# Patient Record
Sex: Female | Born: 1995 | Race: White | Hispanic: No | Marital: Single | Smoking: Former smoker
Health system: Southern US, Community
[De-identification: ages and names within clinical notes are randomized; demographics above are authoritative.]

## PROBLEM LIST (undated history)

## (undated) DIAGNOSIS — F429 Obsessive-compulsive disorder, unspecified: Secondary | ICD-10-CM

## (undated) DIAGNOSIS — F319 Bipolar disorder, unspecified: Secondary | ICD-10-CM

## (undated) DIAGNOSIS — F4310 Post-traumatic stress disorder, unspecified: Secondary | ICD-10-CM

---

## 2016-02-17 ENCOUNTER — Emergency Department
Admission: EM | Admit: 2016-02-17 | Discharge: 2016-02-17 | Disposition: A | Discharge status: Home / Self Care | Payer: No Typology Code available for payment source | Attending: Emergency Medicine | Admitting: Emergency Medicine

## 2016-02-17 ENCOUNTER — Encounter: Payer: Self-pay | Admitting: Emergency Medicine

## 2016-02-17 DIAGNOSIS — Z87891 Personal history of nicotine dependence: Secondary | ICD-10-CM | POA: Diagnosis not present

## 2016-02-17 DIAGNOSIS — T7421XA Adult sexual abuse, confirmed, initial encounter: Secondary | ICD-10-CM | POA: Diagnosis not present

## 2016-02-17 HISTORY — DX: Post-traumatic stress disorder, unspecified: F43.10

## 2016-02-17 HISTORY — DX: Bipolar disorder, unspecified: F31.9

## 2016-02-17 HISTORY — DX: Obsessive-compulsive disorder, unspecified: F42.9

## 2016-02-17 LAB — URINALYSIS, ROUTINE W REFLEX MICROSCOPIC
Bacteria, UA: NONE SEEN
Bilirubin Urine: NEGATIVE
GLUCOSE, UA: NEGATIVE mg/dL
KETONES UR: 20 mg/dL — AB
Leukocytes, UA: NEGATIVE
Nitrite: NEGATIVE
PH: 5 (ref 5.0–8.0)
Protein, ur: 30 mg/dL — AB
Specific Gravity, Urine: 1.031 — ABNORMAL HIGH (ref 1.005–1.030)

## 2016-02-17 LAB — POCT PREGNANCY, URINE: Preg Test, Ur: NEGATIVE

## 2016-02-17 MED ORDER — AZITHROMYCIN 500 MG PO TABS
2000.0000 mg | ORAL_TABLET | Freq: Once | ORAL | Status: AC
  Administered 2016-02-17: 2000 mg via ORAL
  Filled 2016-02-17: qty 2

## 2016-02-17 MED ORDER — CEFTRIAXONE SODIUM 250 MG IJ SOLR
INTRAMUSCULAR | Status: AC
  Filled 2016-02-17: qty 250

## 2016-02-17 MED ORDER — LIDOCAINE HCL (PF) 1 % IJ SOLN
INTRAMUSCULAR | Status: DC
Start: 2016-02-17 — End: 2016-02-17
  Filled 2016-02-17: qty 5

## 2016-02-17 MED ORDER — ONDANSETRON 4 MG PO TBDP: ORAL_TABLET | ORAL | 0 refills | Status: DC

## 2016-02-17 MED ORDER — ONDANSETRON 4 MG PO TBDP
4.0000 mg | ORAL_TABLET | Freq: Once | ORAL | Status: AC
  Administered 2016-02-17: 4 mg via ORAL
  Filled 2016-02-17: qty 1

## 2016-02-17 MED ORDER — AZITHROMYCIN 500 MG PO TABS
ORAL_TABLET | ORAL | Status: AC
  Filled 2016-02-17: qty 2

## 2016-02-17 MED ORDER — GENTAMICIN SULFATE 40 MG/ML IJ SOLN
240.0000 mg | Freq: Once | INTRAMUSCULAR | Status: AC
  Administered 2016-02-17: 240 mg via INTRAMUSCULAR
  Filled 2016-02-17: qty 6

## 2016-02-17 NOTE — ED Notes (Signed)
Pt given flagyl 546m (4 tabs) and phenergran 211m*3tabs) given from the sane kit via pyxis. Pt given directions on how to take
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
## 2016-03-03 ENCOUNTER — Encounter: Payer: Self-pay | Admitting: *Deleted

## 2016-03-03 ENCOUNTER — Other Ambulatory Visit: Payer: Self-pay

## 2016-03-03 ENCOUNTER — Inpatient Hospital Stay
Admission: RE | Admit: 2016-03-03 | Discharge: 2016-03-10 | DRG: 885 | Disposition: A | Discharge status: Home / Self Care | Payer: No Typology Code available for payment source | Source: Intra-hospital | Attending: Psychiatry | Admitting: Psychiatry

## 2016-03-03 ENCOUNTER — Emergency Department
Admission: EM | Admit: 2016-03-03 | Discharge: 2016-03-03 | Disposition: A | Discharge status: Other Acute Inpatient Hospital | Payer: No Typology Code available for payment source | Attending: Student in an Organized Health Care Education/Training Program | Admitting: Student in an Organized Health Care Education/Training Program

## 2016-03-03 DIAGNOSIS — F3010 Manic episode without psychotic symptoms, unspecified: Secondary | ICD-10-CM

## 2016-03-03 DIAGNOSIS — Z79899 Other long term (current) drug therapy: Secondary | ICD-10-CM

## 2016-03-03 DIAGNOSIS — F1010 Alcohol abuse, uncomplicated: Secondary | ICD-10-CM | POA: Diagnosis present

## 2016-03-03 DIAGNOSIS — F309 Manic episode, unspecified: Secondary | ICD-10-CM | POA: Insufficient documentation

## 2016-03-03 DIAGNOSIS — F1210 Cannabis abuse, uncomplicated: Secondary | ICD-10-CM | POA: Diagnosis present

## 2016-03-03 DIAGNOSIS — F429 Obsessive-compulsive disorder, unspecified: Secondary | ICD-10-CM | POA: Diagnosis present

## 2016-03-03 DIAGNOSIS — W19XXXA Unspecified fall, initial encounter: Secondary | ICD-10-CM

## 2016-03-03 DIAGNOSIS — Z87891 Personal history of nicotine dependence: Secondary | ICD-10-CM

## 2016-03-03 DIAGNOSIS — F319 Bipolar disorder, unspecified: Secondary | ICD-10-CM

## 2016-03-03 DIAGNOSIS — F419 Anxiety disorder, unspecified: Secondary | ICD-10-CM | POA: Diagnosis present

## 2016-03-03 DIAGNOSIS — G4700 Insomnia, unspecified: Secondary | ICD-10-CM | POA: Diagnosis present

## 2016-03-03 DIAGNOSIS — F312 Bipolar disorder, current episode manic severe with psychotic features: Secondary | ICD-10-CM | POA: Diagnosis present

## 2016-03-03 DIAGNOSIS — F4310 Post-traumatic stress disorder, unspecified: Secondary | ICD-10-CM | POA: Diagnosis present

## 2016-03-03 LAB — COMPREHENSIVE METABOLIC PANEL
ALBUMIN: 5 g/dL (ref 3.5–5.0)
ALK PHOS: 40 U/L (ref 38–126)
ALT: 18 U/L (ref 14–54)
AST: 26 U/L (ref 15–41)
Anion gap: 9 (ref 5–15)
BILIRUBIN TOTAL: 0.5 mg/dL (ref 0.3–1.2)
BUN: 15 mg/dL (ref 6–20)
CALCIUM: 9.4 mg/dL (ref 8.9–10.3)
CO2: 29 mmol/L (ref 22–32)
Chloride: 100 mmol/L — ABNORMAL LOW (ref 101–111)
Creatinine, Ser: 0.62 mg/dL (ref 0.44–1.00)
GFR calc Af Amer: >60 mL/min (ref >60)
Glucose, Bld: 90 mg/dL (ref 65–99)
POTASSIUM: 3.5 mmol/L (ref 3.5–5.1)
Sodium: 138 mmol/L (ref 135–145)
TOTAL PROTEIN: 8.3 g/dL — AB (ref 6.5–8.1)

## 2016-03-03 LAB — URINE DRUG SCREEN, QUALITATIVE (ARMC ONLY)
Amphetamines, Ur Screen: NOT DETECTED
BARBITURATES, UR SCREEN: NOT DETECTED
BENZODIAZEPINE, UR SCRN: NOT DETECTED
CANNABINOID 50 NG, UR ~~LOC~~: NOT DETECTED
Cocaine Metabolite,Ur ~~LOC~~: NOT DETECTED
MDMA (Ecstasy)Ur Screen: NOT DETECTED
Methadone Scn, Ur: NOT DETECTED
OPIATE, UR SCREEN: NOT DETECTED
PHENCYCLIDINE (PCP) UR S: NOT DETECTED
Tricyclic, Ur Screen: NOT DETECTED

## 2016-03-03 LAB — SALICYLATE LEVEL: Salicylate Lvl: <7 mg/dL (ref 2.8–30.0)

## 2016-03-03 LAB — CBC
HEMATOCRIT: 39.1 % (ref 35.0–47.0)
Hemoglobin: 13.6 g/dL (ref 12.0–16.0)
MCH: 31.6 pg (ref 26.0–34.0)
MCHC: 34.7 g/dL (ref 32.0–36.0)
MCV: 91 fL (ref 80.0–100.0)
PLATELETS: 286 10*3/uL (ref 150–440)
RBC: 4.29 MIL/uL (ref 3.80–5.20)
RDW: 13.3 % (ref 11.5–14.5)
WBC: 9.7 10*3/uL (ref 3.6–11.0)

## 2016-03-03 LAB — POCT PREGNANCY, URINE: PREG TEST UR: NEGATIVE

## 2016-03-03 LAB — ACETAMINOPHEN LEVEL: Acetaminophen (Tylenol), Serum: <10 ug/mL — ABNORMAL LOW (ref 10–30)

## 2016-03-03 LAB — ETHANOL: Alcohol, Ethyl (B): <5 mg/dL

## 2016-03-03 MED ORDER — QUETIAPINE FUMARATE 25 MG PO TABS: 50.0000 mg | ORAL_TABLET | Freq: Every day | ORAL | Status: DC

## 2016-03-03 MED ORDER — ACETAMINOPHEN 325 MG PO TABS: 650.0000 mg | ORAL_TABLET | Freq: Four times a day (QID) | ORAL | Status: DC | PRN

## 2016-03-03 MED ORDER — DIAZEPAM 5 MG PO TABS: 10.0000 mg | ORAL_TABLET | Freq: Once | ORAL | Status: DC

## 2016-03-03 MED ORDER — DIVALPROEX SODIUM 500 MG PO DR TAB
500.0000 mg | DELAYED_RELEASE_TABLET | Freq: Two times a day (BID) | ORAL | Status: DC
  Filled 2016-03-03: qty 1

## 2016-03-03 MED ORDER — DIVALPROEX SODIUM 500 MG PO DR TAB: 500.0000 mg | DELAYED_RELEASE_TABLET | Freq: Two times a day (BID) | ORAL | Status: DC

## 2016-03-03 MED ORDER — ALUM & MAG HYDROXIDE-SIMETH 200-200-20 MG/5ML PO SUSP: 30.0000 mL | ORAL | Status: DC | PRN

## 2016-03-03 MED ORDER — MAGNESIUM HYDROXIDE 400 MG/5ML PO SUSP: 30.0000 mL | Freq: Every day | ORAL | Status: DC | PRN

## 2016-03-03 MED ORDER — HYDROXYZINE HCL 25 MG PO TABS
25.0000 mg | ORAL_TABLET | Freq: Three times a day (TID) | ORAL | Status: DC | PRN
  Administered 2016-03-06: 25 mg via ORAL
  Filled 2016-03-03 (×2): qty 1
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
## 2016-03-03 NOTE — BH Assessment (Signed)
Per Dr. Toni Amendlapacs - patient [facility].  Dr. Toni Amendlapacs reports that he has already spoken to the Charge Nurse Jamesetta So(Phyllis) regarding the patient coming the unit this evening.  Writer was not able to speak to the the Charge Nurse to obtain the bed assignment information and to to the nurse working the patient.

## 2016-03-03 NOTE — ED Notes (Signed)
Pt given supper tray.
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
## 2016-03-03 NOTE — ED Notes (Signed)
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
## 2016-03-03 NOTE — ED Notes (Signed)
PT  VOL  SEEN  BY  DR  CLAPACS  PENDING  PLACEMENT 

## 2016-03-03 NOTE — ED Notes (Signed)
pt

## 2016-03-03 NOTE — BH Assessment (Addendum)
[facility] by Dr. Toni Amendlapacs.  Attending Physician will be Dr. Jennet MaduroPucilowska.   Patient has been [facility] to room 315, by [facility] Charge Nurse [place],RN.   ER staff is aware of the admission Delaney Meigs( Tamara, ER Sect.; Sue LushAndrea,  Patient's Nurse & Casmine, Patient Access).  EDP Dr. Roxan Hockeyobinson aware of admission per Sue LushAndrea, RN.   Support paperwork has been completed and submitted to [place], Charity fundraiserN.

## 2016-03-03 NOTE — BH Assessment (Signed)
Pt chart under review by Andrey CotaBukola, Charge RN for bed assignment.
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
## 2016-03-04 ENCOUNTER — Inpatient Hospital Stay: Payer: No Typology Code available for payment source

## 2016-03-04 DIAGNOSIS — F429 Obsessive-compulsive disorder, unspecified: Secondary | ICD-10-CM | POA: Diagnosis present

## 2016-03-04 DIAGNOSIS — F1010 Alcohol abuse, uncomplicated: Secondary | ICD-10-CM | POA: Diagnosis present

## 2016-03-04 DIAGNOSIS — F312 Bipolar disorder, current episode manic severe with psychotic features: Principal | ICD-10-CM

## 2016-03-04 DIAGNOSIS — F1210 Cannabis abuse, uncomplicated: Secondary | ICD-10-CM | POA: Diagnosis present

## 2016-03-04 DIAGNOSIS — F4310 Post-traumatic stress disorder, unspecified: Secondary | ICD-10-CM | POA: Diagnosis present

## 2016-03-04 LAB — LIPID PANEL
Cholesterol: 167 mg/dL (ref 0–200)
HDL: 57 mg/dL (ref >40)
LDL CALC: 102 mg/dL — AB (ref 0–99)
TRIGLYCERIDES: 42 mg/dL (ref <150)
Total CHOL/HDL Ratio: 2.9 RATIO
VLDL: 8 mg/dL (ref 0–40)

## 2016-03-04 LAB — TSH: TSH: 2.596 u[IU]/mL (ref 0.350–4.500)

## 2016-03-04 LAB — VALPROIC ACID LEVEL: Valproic Acid Lvl: 28 ug/mL — ABNORMAL LOW (ref 50.0–100.0)

## 2016-03-04 MED ORDER — LORAZEPAM 2 MG PO TABS
2.0000 mg | ORAL_TABLET | Freq: Once | ORAL | Status: AC
  Administered 2016-03-04: 2 mg via ORAL
  Filled 2016-03-04: qty 1

## 2016-03-04 MED ORDER — DIPHENHYDRAMINE HCL 25 MG PO CAPS
50.0000 mg | ORAL_CAPSULE | Freq: Once | ORAL | Status: AC
  Administered 2016-03-04: 50 mg via ORAL
  Filled 2016-03-04: qty 2

## 2016-03-04 MED ORDER — HALOPERIDOL 2 MG PO TABS
2.0000 mg | ORAL_TABLET | Freq: Once | ORAL | Status: AC
  Administered 2016-03-04: 2 mg via ORAL
  Filled 2016-03-04: qty 1

## 2016-03-04 MED ORDER — ZIPRASIDONE HCL 40 MG PO CAPS
40.0000 mg | ORAL_CAPSULE | Freq: Two times a day (BID) | ORAL | Status: DC
  Administered 2016-03-04 – 2016-03-05 (×2): 40 mg via ORAL
  Filled 2016-03-04 (×2): qty 1

## 2016-03-04 MED ORDER — TEMAZEPAM 15 MG PO CAPS
15.0000 mg | ORAL_CAPSULE | Freq: Every day | ORAL | Status: DC
  Administered 2016-03-04: 15 mg via ORAL
  Filled 2016-03-04: qty 1

## 2016-03-04 NOTE — Progress Notes (Signed)
Recreation Therapy Notes  Date: 02.28.18 Time: 9:30 am Location: Craft Room  Group Topic: Self-esteem  Goal Area(s) Addresses:  Patient will identify at least one positive trait about self. Patient will identify at least one healthy coping skill.  Behavioral Response: Did not attend  Intervention: All About Me  Activity: Patients were instructed to make an All About Me pamphlet including their life's motto, positive traits, healthy coping skills, and their support system.  Education: LRT educated patients on ways they can increase their self-esteem.  Education Outcome: Patient did not attend group.  Clinical Observations/Feedback: Patient invited, but did not attend group.  Zuleyka Kloc M, LRT/CTRS 03/04/2016 10:00 AM 

## 2016-03-04 NOTE — Tx Team (Signed)
Interdisciplinary Treatment and Diagnostic Plan Update  03/04/2016 Time of Session: [place] MRN: [id number]  Principal Diagnosis: Bipolar I disorder, current or most recent episode manic, with psychotic features ([place])  Secondary Diagnoses: Principal Problem:   Bipolar I disorder, current or most recent episode manic, with psychotic features ([place]) Active Problems:   OCD (obsessive compulsive disorder)   PTSD (post-traumatic stress disorder)   Cannabis use disorder, mild, abuse   Alcohol use disorder, mild, abuse   Current Medications:  Current Facility-Administered Medications  Medication Dose Route Frequency Provider Last Rate Last Dose  . acetaminophen (TYLENOL) tablet 650 mg  650 mg Oral Q6H PRN Gonzella Lex, MD      . alum & mag hydroxide-simeth (MAALOX/MYLANTA) 200-200-20 MG/5ML suspension 30 mL  30 mL Oral Q4H PRN Gonzella Lex, MD      . hydrOXYzine (ATARAX/VISTARIL) tablet 25 mg  25 mg Oral TID PRN Gonzella Lex, MD      . magnesium hydroxide (MILK OF MAGNESIA) suspension 30 mL  30 mL Oral Daily PRN Gonzella Lex, MD      . temazepam (RESTORIL) capsule 15 mg  15 mg Oral QHS Jolanta B Pucilowska, MD      . ziprasidone (GEODON) capsule 40 mg  40 mg Oral BID WC Jolanta B Pucilowska, MD       PTA Medications: Prescriptions Prior to Admission  Medication Sig Dispense Refill Last Dose  . divalproex (DEPAKOTE ER) 500 MG 24 hr tablet Take 500 mg by mouth 2 (two) times daily.   UNKNOWN at UNKNOWN  . ondansetron (ZOFRAN ODT) 4 MG disintegrating tablet Allow 1-2 tablets to dissolve in your mouth every 8 hours as needed for nausea/vomiting (Patient not taking: Reported on 03/03/2016) 30 tablet 0 Not Taking at Unknown time  . QUEtiapine (SEROQUEL) 100 MG tablet Take 100-200 mg by mouth at bedtime as needed.    UNKNOWN at UNKNOWN    Patient Stressors: Medication change or noncompliance Substance abuse Traumatic event  Patient Strengths: Curator  fund of knowledge Motivation for treatment/growth  Treatment Modalities: Medication Management, Group therapy, Case management,  1 to 1 session with clinician, Psychoeducation, Recreational therapy.   Physician Treatment Plan for Primary Diagnosis: Bipolar I disorder, current or most recent episode manic, with psychotic features ([place]) Long Term Goal(s): Improvement in symptoms so as ready for discharge Improvement in symptoms so as ready for discharge   Short Term Goals: Ability to identify changes in lifestyle to reduce recurrence of condition will improve Ability to verbalize feelings will improve Ability to disclose and discuss suicidal ideas Ability to demonstrate self-control will improve Ability to identify and develop effective coping behaviors will improve Compliance with prescribed medications will improve Ability to identify triggers associated with substance abuse/mental health issues will improve Ability to identify changes in lifestyle to reduce recurrence of condition will improve Ability to demonstrate self-control will improve Ability to identify triggers associated with substance abuse/mental health issues will improve  Medication Management: Evaluate patient's response, side effects, and tolerance of medication regimen.  Therapeutic Interventions: 1 to 1 sessions, Unit Group sessions and Medication administration.  Evaluation of Outcomes: Not Met  Physician Treatment Plan for Secondary Diagnosis: Principal Problem:   Bipolar I disorder, current or most recent episode manic, with psychotic features ([place]) Active Problems:   OCD (obsessive compulsive disorder)   PTSD (post-traumatic stress disorder)   Cannabis use disorder, mild, abuse   Alcohol use disorder, mild, abuse  Long Term Goal(s): Improvement  in symptoms so as ready for discharge Improvement in symptoms so as ready for discharge   Short Term Goals: Ability to identify changes in lifestyle to reduce  recurrence of condition will improve Ability to verbalize feelings will improve Ability to disclose and discuss suicidal ideas Ability to demonstrate self-control will improve Ability to identify and develop effective coping behaviors will improve Compliance with prescribed medications will improve Ability to identify triggers associated with substance abuse/mental health issues will improve Ability to identify changes in lifestyle to reduce recurrence of condition will improve Ability to demonstrate self-control will improve Ability to identify triggers associated with substance abuse/mental health issues will improve     Medication Management: Evaluate patient's response, side effects, and tolerance of medication regimen.  Therapeutic Interventions: 1 to 1 sessions, Unit Group sessions and Medication administration.  Evaluation of Outcomes: Not Met   RN Treatment Plan for Primary Diagnosis: Bipolar I disorder, current or most recent episode manic, with psychotic features ([place]) Long Term Goal(s): Knowledge of disease and therapeutic regimen to maintain health will improve  Short Term Goals: Ability to demonstrate self-control, Ability to verbalize feelings will improve, Ability to disclose and discuss suicidal ideas, Ability to identify and develop effective coping behaviors will improve and Compliance with prescribed medications will improve  Medication Management: RN will administer medications as ordered by provider, will assess and evaluate patient's response and provide education to patient for prescribed medication. RN will report any adverse and/or side effects to prescribing provider.  Therapeutic Interventions: 1 on 1 counseling sessions, Psychoeducation, Medication administration, Evaluate responses to treatment, Monitor vital signs and CBGs as ordered, Perform/monitor CIWA, COWS, AIMS and Fall Risk screenings as ordered, Perform wound care treatments as ordered.  Evaluation of  Outcomes: Not Met   LCSW Treatment Plan for Primary Diagnosis: Bipolar I disorder, current or most recent episode manic, with psychotic features ([place]) Long Term Goal(s): Safe transition to appropriate next level of care at discharge, Engage patient in therapeutic group addressing interpersonal concerns.  Short Term Goals: Engage patient in aftercare planning with referrals and resources, Facilitate acceptance of mental health diagnosis and concerns and Increase skills for wellness and recovery  Therapeutic Interventions: Assess for all discharge needs, 1 to 1 time with Social worker, Explore available resources and support systems, Assess for adequacy in community support network, Educate family and significant other(s) on suicide prevention, Complete Psychosocial Assessment, Interpersonal group therapy.  Evaluation of Outcomes: Not Met   Progress in Treatment: Attending groups: No. Participating in groups: No. Taking medication as prescribed: Yes. Toleration medication: Yes. Family/Significant other contact made: No, will contact:  when given permission Patient understands diagnosis: Yes. Discussing patient identified problems/goals with staff: Yes. Medical problems stabilized or resolved: Yes. Denies suicidal/homicidal ideation: Yes. Issues/concerns per patient self-inventory: No. Other: none  New problem(s) identified: No, Describe:  none  New Short Term/Long Term Goal(s): Pt could not attend meeting, unable to identify goal.  Discharge Plan or Barriers: CSW assessing for appropriate plan.  Reason for Continuation of Hospitalization: Mania  Estimated Length of Stay: 3-5 days.  Attendees: Patient: 03/04/2016   Physician: Dr. Bary Leriche, MD 03/04/2016   Nursing: Elige Radon, RN 03/04/2016   RN Care Manager: 03/04/2016   Social Worker: Lurline Idol, LCSW 03/04/2016   Recreational Therapist: Everitt Amber, LRT/CTRS  03/04/2016   Other:  03/04/2016   Other:  03/04/2016   Other:  03/04/2016        Scribe for Treatment Team: Joanne Chars, LCSW 03/04/2016 4:14 PM

## 2016-03-04 NOTE — Progress Notes (Signed)
Chaplain received an order to visit with pt in room 315. Pt requested a catholic priest but said she was going to contact the priest. Lunette StandsChaplain is available for follow-up as needed.   03/04/16 1600  Clinical Encounter Type  Visited With Patient  Visit Type Initial;Spiritual support  Referral From Nurse  Consult/Referral To Chaplain  Spiritual Encounters  Spiritual Needs Other (Comment)

## 2016-03-04 NOTE — Progress Notes (Addendum)
Patient was noted sitting on the floor, she stated 'l was in the shower when l bumped my head against the wall, so l lowered my self to the floor" upon assessment no visible injury noted, PERRLA, bilateral hand grip equal, ROME X 4, no internal rotation noted, bilateral legs are equal in length. Patient endorsed a headache and rated pain like 5/10 on a pain scale 0-10, no change in LOC noted, denies nausea. Patient  refused to take medication for the pain.The MD on call notified and CT HEAD SCAN without contrast was ordered. Patient was initially reluctant but eventually agreed to have the test. MD also ordered some medication to help with anxiety, restlessness and hallucination. Patient was educated to call for assistance before getting out of bed, call light within easy reach, more frequent visual checks initiated. Will continue to closely monitor patient.
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
## 2016-03-04 NOTE — [facility] Group Notes (Signed)
[facility] Group Notes:  (Nursing/MHT/Case Management/Adjunct)  Date:  03/04/2016  Time:  5:09 PM  Type of Therapy:  Psychoeducational Skills  Participation Level:  Active  Participation Quality:  Attentive, Monopolizing and Redirectable  Affect:  Excited  Cognitive:  Alert and Oriented  Insight:  Lacking  Engagement in Group:  Engaged and Monopolizing  Modes of Intervention:  Discussion and Education  Summary of Progress/Problems:  Deborah Parker 03/04/2016, 5:09 PM
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
## 2016-03-04 NOTE — Progress Notes (Signed)
POST FALL; Patient appears stable, no apparent injury noted, VS WNL, patient denies nausea and headache. Patient requested family not be notified of the fall, no acute distress noted at this time. Writer awaiting CT scan report, 15 minutes checks maintained, will continue to monitor.

## 2016-03-04 NOTE — Progress Notes (Signed)
MHT reports that she found patient laying in the floor and the bathroom and patient states that she did not fall. Went to talk with patient.  Patient reports "I was in the shower and felt dizzy so I lay down in the floor and I pooped myself"  Small amount of stool noted in the shower.
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
## 2016-03-04 NOTE — BHH Group Notes (Signed)
BHH LCSW Group Therapy  03/04/2016 4:20 PM  Initially came to group, set down her things and then left without returning until the group was ending.  Deborah Parker 03/04/2016, 4:20 PM

## 2016-03-05 LAB — PROLACTIN: PROLACTIN: 76.2 ng/mL — AB (ref 4.8–23.3)

## 2016-03-05 LAB — HEMOGLOBIN A1C
Hgb A1c MFr Bld: 5.1 % (ref 4.8–5.6)
MEAN PLASMA GLUCOSE: 100 mg/dL

## 2016-03-05 MED ORDER — TEMAZEPAM 15 MG PO CAPS
30.0000 mg | ORAL_CAPSULE | Freq: Every day | ORAL | Status: DC
  Administered 2016-03-05 – 2016-03-08 (×3): 30 mg via ORAL
  Filled 2016-03-05 (×4): qty 2

## 2016-03-05 MED ORDER — ZIPRASIDONE HCL 40 MG PO CAPS
80.0000 mg | ORAL_CAPSULE | Freq: Two times a day (BID) | ORAL | Status: DC
  Administered 2016-03-05 – 2016-03-10 (×10): 80 mg via ORAL
  Filled 2016-03-05 (×10): qty 2

## 2016-03-05 NOTE — Plan of Care (Signed)
Problem: Coping: Goal: Ability to cope will improve Outcome: Not Met (add Reason) Patient remains anxious with delusions of grandeur. Hyper verbal. Restless. Po bedtime medications given. No PRNs. q 15 min checks maintained for safety.
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
## 2016-03-05 NOTE — BHH Group Notes (Signed)
BHH Group Notes:  (Nursing/MHT/Case Management/Adjunct)  Date:  03/05/2016  Time:  1:27 AM  Type of Therapy:  Psychoeducational Skills  Participation Level:  Active  Participation Quality:  Appropriate  Affect:  Appropriate  Cognitive:  Alert  Insight:  Good  Engagement in Group:  Supportive  Modes of Intervention:  Support  Summary of Progress/Problems:  Deborah Parker 03/05/2016, 1:27 AM
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
## 2016-03-05 NOTE — Progress Notes (Signed)
Pt requesting discharged, signed request for discharge at 1534 on Thursday, March 05, 2016. Signed form placed chart. MD paged. Safety maintained. Will continue to monitor.

## 2016-03-05 NOTE — Progress Notes (Signed)
Pt awake, alert, and oriented up on unit today with multiple repeated requests. Pt is psychotic, manic, disorganized with flight of ideas, rapid, pressured speech. Reported fall x 3 last night, but after speaking with doctor, pt had fell the day/night before as noted, pt is just repeating the story of events to this nurse. Pt is very hyper verbal. Continues to request the catholic priest from [employer] to visit her, pt reeducated on visiting hours and phone times. Pt reports good sleep last night although it is reported she only slept 4 hours. Rates appetite good, hyper energy, poor concentration related to "concussion." Pt repeatedly requests "those sunglasses they give you when they dialyze your eyes because my eyes are sensitive to all these bright lights." Pt denies SI/HI/AVH. Reports today her goal is "slowing my brain down" by "seeing a priest." Rates depression 4-5/10, hopelessness 0/10, anxiety 8/10 (low 0-10 high). Pt is medication compliant today. Attended some groups, but often leaves early.  Support and encouragement provided. Medications administered as ordered with education. Safety maintained with every 15 minute checks. Will continue to monitor.

## 2016-03-05 NOTE — Progress Notes (Signed)
Recreation Therapy Notes  Date: 03.01.18 Time: 9:30 am Location: Craft Room  Group Topic: Leisure Education  Goal Area(s) Addresses:  Patient will identify things they are grateful for. Patient will identify how being grateful can influence decision making.  Behavioral Response: Attentive, Interactive, Left early  Intervention: Grateful Wheel  Activity: Patients were given an I Am Grateful For worksheet and were instructed to write items they were grateful for under each category.  Education: LRT educated patients on leisure and why it is important.  Education Outcome: Patient left before LRT educated group.  Clinical Observations/Feedback: Patient wrote things she was grateful for. Patient laid her head on the table then stated she kept falling in the bathroom and had a concussion and wanted sunglasses. Patient left group at approximately 9:47 am to talk to her nurse. Patient returned to group stating she needed to lay down and was there to get her things. Patient stated things she is grateful for and left. Patient did not return to group.  Jacquelynn CreeGreene,Endya Austin M, LRT/CTRS 03/05/2016 10:09 AM
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
## 2016-03-05 NOTE — [facility] Group Notes (Signed)
[facility] LCSW Group Therapy Note  Type of Therapy and Topic:  Group Therapy:  Goals Group: SMART Goals  Participation Level:  Patient did not attend group. CSW invited patient to group.   Description of Group:   The purpose of a daily goals group is to assist and guide patients in setting recovery/wellness-related goals.  The objective is to set goals as they relate to the crisis in which they were admitted. Patients will be using SMART goal modalities to set measurable goals.  Characteristics of realistic goals will be discussed and patients will be assisted in setting and processing how one will reach their goal. Facilitator will also assist patients in applying interventions and coping skills learned in psycho-education groups to the SMART goal and process how one will achieve defined goal.  Therapeutic Goals: -Patients will develop and document one goal related to or their crisis in which brought them into treatment. -Patients will be guided by LCSW using SMART goal setting modality in how to set a measurable, attainable, realistic and time sensitive goal.  -Patients will process barriers in reaching goal. -Patients will process interventions in how to overcome and successful in reaching goal.   Summary of Patient Progress:  Patient Goal: Patient did not attend group. CSW invited patient to group.    Therapeutic Modalities:   Motivational Interviewing Engineer, manufacturing systemsCognitive Behavioral Therapy Crisis Intervention Model SMART goals setting  Melody Savidge G. Garnette CzechSampson MSW, LCSWA 03/05/2016 11:03 AM
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
## 2016-03-05 NOTE — Plan of Care (Signed)
Problem: Activity: Goal: Sleeping patterns will improve Outcome: Not Progressing It is reported pt slept 4 hours last night. Pt does report good sleep though. Pt is found sleeping in her room today.

## 2016-03-05 NOTE — [facility] Group Notes (Signed)
[facility] LCSW Group Therapy  03/05/2016 1:56 PM  Type of Therapy:  Group Therapy  Participation Level:  Patient did not attend group. CSW invited patient to group.   Summary of Progress/Problems: Balance in life: Patients will discuss the concept of balance and how it looks and feels to be unbalanced. Pt will identify areas in their life that is unbalanced and ways to become more balanced. They discussed what aspects in their lives has influenced their self care. Patients also discussed self care in the areas of self regulation/control, hygiene/appearance, sleep/relaxation, healthy leisure, healthy eating habits, exercise, inner peace/spirituality, self improvement, sobriety, and health management. They were challenged to identify changes that are needed in order to improve self care.   Valleri Hendricksen G. Garnette CzechSampson MSW, LCSWA 03/05/2016, 1:57 PM

## 2016-03-06 MED ORDER — LITHIUM CARBONATE ER 300 MG PO TBCR
300.0000 mg | EXTENDED_RELEASE_TABLET | Freq: Two times a day (BID) | ORAL | Status: DC
  Administered 2016-03-06 – 2016-03-10 (×7): 300 mg via ORAL
  Filled 2016-03-06 (×7): qty 1

## 2016-03-06 MED ORDER — TRAZODONE HCL 100 MG PO TABS
100.0000 mg | ORAL_TABLET | Freq: Every day | ORAL | Status: DC
  Administered 2016-03-08: 100 mg via ORAL
  Filled 2016-03-06 (×3): qty 1

## 2016-03-06 NOTE — Progress Notes (Signed)
Patient remains manic, needy and childlike. Pt is anxious, fidgety and hyper verbal. Tangential with FOI.  Pt present with multiple complaints states she feels her needs aren't being met, concerned with hospital bill .Marland KitchenMarland KitchenPRNs offered but refused. Will continue to monitor for safety and behavior.

## 2016-03-06 NOTE — [facility] Group Notes (Signed)
[facility] Group Notes:  (Nursing/MHT/Case Management/Adjunct)  Date:  03/06/2016  Time:  3:54 AM  Type of Therapy:  Psychoeducational Skills  Participation Level:  Did Not Attend    Summary of Progress/Problems:  Deborah Parker 03/06/2016, 3:54 AM

## 2016-03-06 NOTE — Progress Notes (Signed)
Recreation Therapy Notes  Date: 03.02.18 Time: 9:30 am Location: Craft Room  Group Topic: Coping Skills  Goal Area(s) Addresses:  Patient will participate in healthy coping skill. Patient will verbalize benefit of using art as a coping skill.  Behavioral Response: Intermittently Attentive  Intervention: Coloring  Activity: Patients were given coloring sheets to color and were instructed to think about what emotions they were feeling and what their minds were focused on.  Education: LRT educated patients on healthy coping skills.  Education Outcome: In group clarification offered  Clinical Observations/Feedback: Patient colored some of her coloring sheet. Patient read and filled out paper in her blue folder. Patient would get up to ask LRT questions about paperwork. Patient stated her head felt like it was "full of cotton balls". LRT told patient she could see her nurse but patient did not want to. Patient laid her head on the table. Patient did not contribute to group discussion.  Jacquelynn CreeGreene,Terrye Dombrosky M, LRT/CTRS 03/06/2016 10:22 AM

## 2016-03-06 NOTE — Tx Team (Signed)
Interdisciplinary Treatment and Diagnostic Plan Update  03/06/2016 Time of Session: [place] MRN: [id number]  Principal Diagnosis: Bipolar I disorder, current or most recent episode manic, with psychotic features ([place])  Secondary Diagnoses: Principal Problem:   Bipolar I disorder, current or most recent episode manic, with psychotic features ([place]) Active Problems:   OCD (obsessive compulsive disorder)   PTSD (post-traumatic stress disorder)   Cannabis use disorder, mild, abuse   Alcohol use disorder, mild, abuse   Current Medications:  Current Facility-Administered Medications  Medication Dose Route Frequency Provider Last Rate Last Dose  . acetaminophen (TYLENOL) tablet 650 mg  650 mg Oral Q6H PRN Gonzella Lex, MD      . alum & mag hydroxide-simeth (MAALOX/MYLANTA) 200-200-20 MG/5ML suspension 30 mL  30 mL Oral Q4H PRN Gonzella Lex, MD      . hydrOXYzine (ATARAX/VISTARIL) tablet 25 mg  25 mg Oral TID PRN Gonzella Lex, MD   25 mg at 03/06/16 0844  . magnesium hydroxide (MILK OF MAGNESIA) suspension 30 mL  30 mL Oral Daily PRN Gonzella Lex, MD      . temazepam (RESTORIL) capsule 30 mg  30 mg Oral QHS Clovis Fredrickson, MD   30 mg at 03/05/16 2153  . ziprasidone (GEODON) capsule 80 mg  80 mg Oral BID WC Jolanta B Pucilowska, MD   80 mg at 03/06/16 0844   PTA Medications: Prescriptions Prior to Admission  Medication Sig Dispense Refill Last Dose  . divalproex (DEPAKOTE ER) 500 MG 24 hr tablet Take 500 mg by mouth 2 (two) times daily.   UNKNOWN at UNKNOWN  . ondansetron (ZOFRAN ODT) 4 MG disintegrating tablet Allow 1-2 tablets to dissolve in your mouth every 8 hours as needed for nausea/vomiting (Patient not taking: Reported on 03/03/2016) 30 tablet 0 Not Taking at Unknown time  . QUEtiapine (SEROQUEL) 100 MG tablet Take 100-200 mg by mouth at bedtime as needed.    UNKNOWN at UNKNOWN    Patient Stressors: Medication change or noncompliance Substance  abuse Traumatic event  Patient Strengths: Curator fund of knowledge Motivation for treatment/growth  Treatment Modalities: Medication Management, Group therapy, Case management,  1 to 1 session with clinician, Psychoeducation, Recreational therapy.   Physician Treatment Plan for Primary Diagnosis: Bipolar I disorder, current or most recent episode manic, with psychotic features ([place]) Long Term Goal(s): Improvement in symptoms so as ready for discharge Improvement in symptoms so as ready for discharge   Short Term Goals: Ability to identify changes in lifestyle to reduce recurrence of condition will improve Ability to verbalize feelings will improve Ability to disclose and discuss suicidal ideas Ability to demonstrate self-control will improve Ability to identify and develop effective coping behaviors will improve Compliance with prescribed medications will improve Ability to identify triggers associated with substance abuse/mental health issues will improve Ability to identify changes in lifestyle to reduce recurrence of condition will improve Ability to demonstrate self-control will improve Ability to identify triggers associated with substance abuse/mental health issues will improve  Medication Management: Evaluate patient's response, side effects, and tolerance of medication regimen.  Therapeutic Interventions: 1 to 1 sessions, Unit Group sessions and Medication administration.  Evaluation of Outcomes: Not Met  Physician Treatment Plan for Secondary Diagnosis: Principal Problem:   Bipolar I disorder, current or most recent episode manic, with psychotic features ([place]) Active Problems:   OCD (obsessive compulsive disorder)   PTSD (post-traumatic stress disorder)   Cannabis use disorder, mild, abuse   Alcohol  use disorder, mild, abuse  Long Term Goal(s): Improvement in symptoms so as ready for discharge Improvement in symptoms so as ready for discharge    Short Term Goals: Ability to identify changes in lifestyle to reduce recurrence of condition will improve Ability to verbalize feelings will improve Ability to disclose and discuss suicidal ideas Ability to demonstrate self-control will improve Ability to identify and develop effective coping behaviors will improve Compliance with prescribed medications will improve Ability to identify triggers associated with substance abuse/mental health issues will improve Ability to identify changes in lifestyle to reduce recurrence of condition will improve Ability to demonstrate self-control will improve Ability to identify triggers associated with substance abuse/mental health issues will improve     Medication Management: Evaluate patient's response, side effects, and tolerance of medication regimen.  Therapeutic Interventions: 1 to 1 sessions, Unit Group sessions and Medication administration.  Evaluation of Outcomes: Not Met   RN Treatment Plan for Primary Diagnosis: Bipolar I disorder, current or most recent episode manic, with psychotic features ([place]) Long Term Goal(s): Knowledge of disease and therapeutic regimen to maintain health will improve  Short Term Goals: Ability to remain free from injury will improve, Ability to verbalize frustration and anger appropriately will improve, Ability to demonstrate self-control, Ability to participate in decision making will improve, Ability to verbalize feelings will improve, Ability to disclose and discuss suicidal ideas, Ability to identify and develop effective coping behaviors will improve and Compliance with prescribed medications will improve  Medication Management: RN will administer medications as ordered by provider, will assess and evaluate patient's response and provide education to patient for prescribed medication. RN will report any adverse and/or side effects to prescribing provider.  Therapeutic Interventions: 1 on 1 counseling  sessions, Psychoeducation, Medication administration, Evaluate responses to treatment, Monitor vital signs and CBGs as ordered, Perform/monitor CIWA, COWS, AIMS and Fall Risk screenings as ordered, Perform wound care treatments as ordered.  Evaluation of Outcomes: Not Met   LCSW Treatment Plan for Primary Diagnosis: Bipolar I disorder, current or most recent episode manic, with psychotic features ([place]) Long Term Goal(s): Safe transition to appropriate next level of care at discharge, Engage patient in therapeutic group addressing interpersonal concerns.  Short Term Goals: Engage patient in aftercare planning with referrals and resources, Increase social support, Facilitate acceptance of mental health diagnosis and concerns and Increase skills for wellness and recovery  Therapeutic Interventions: Assess for all discharge needs, 1 to 1 time with Social worker, Explore available resources and support systems, Assess for adequacy in community support network, Educate family and significant other(s) on suicide prevention, Complete Psychosocial Assessment, Interpersonal group therapy.  Evaluation of Outcomes: Not Met   Progress in Treatment: Attending groups: Yes. Participating in groups: Yes. Taking medication as prescribed: Yes. Toleration medication: Yes. Family/Significant other contact made: Yes, individual(s) contacted:  parents Patient understands diagnosis: Yes. Discussing patient identified problems/goals with staff: Yes. Medical problems stabilized or resolved: Yes. Denies suicidal/homicidal ideation: Yes. Issues/concerns per patient self-inventory: Yes. Other: none  New problem(s) identified: No, Describe:  none  New Short Term/Long Term Goal(s):  Discharge Plan or Barriers: CSW assessing for appropriate plan.  Reason for Continuation of Hospitalization: Mania  Estimated Length of Stay: 5 days.  Attendees: Patient: 03/06/2016   Physician: Dr. Bary Leriche, MD 03/06/2016    Nursing: Oval Linsey, RN 03/06/2016   RN Care Manager: 03/06/2016   Social Worker: Lurline Idol, LCSW 03/06/2016   Recreational Therapist: Everitt Amber, LRT/CTRS  03/06/2016   Other:  03/06/2016  Other:  03/06/2016   Other: 03/06/2016        Scribe for Treatment Team: Joanne Chars, LCSW 03/06/2016 2:38 PM

## 2016-03-06 NOTE — Consult Note (Signed)
[facility] responded to phone request for consult. PT was thankful to speak with a chaplain, though presented as manic. PT shared her story and we talked about the importance of being present in the space right now to receive the fullness of treatment available. PT appreciated the conversation and expressed loneliness PT spoke highly of other [facility] visits and I anticipate she will request further visitation.
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
## 2016-03-06 NOTE — Plan of Care (Signed)
Problem: Safety: Goal: Ability to remain free from injury will improve Outcome: Not Met (add Reason) Patient remains manic, needy and childlike. Med compliant. No PRNs given. q 15 min checks maintained for safety.

## 2016-03-06 NOTE — Progress Notes (Signed)
Denies SI/HI/AVH.  Patient is labile.  Hyper-verbal. Paranoid and  Suspicious.  Argumentative about medications  But will take them.  Paces halls.  Presents frequently to nurses station with various requests.  One minute tearful wanting to talk to her mother and the next she is talking about her mother not loving her wanting anything to do with her.  Tangential and has flight of ideas.  Support ane encouragement offered.  Safety maintained.

## 2016-03-06 NOTE — [facility] Group Notes (Signed)
[facility] LCSW Group Therapy Note  Date/Time: 03/06/16, 1300  Type of Therapy and Topic:  Group Therapy:  Feelings around Relapse and Recovery  Participation Level:  Did Not Attend   Mood:  Description of Group:    Patients in this group will discuss emotions they experience before and after a relapse. They will process how experiencing these feelings, or avoidance of experiencing them, relates to having a relapse. Facilitator will guide patients to explore emotions they have related to recovery. Patients will be encouraged to process which emotions are more powerful. They will be guided to discuss the emotional reaction significant others in their lives may have to patients' relapse or recovery. Patients will be assisted in exploring ways to respond to the emotions of others without this contributing to a relapse.  Therapeutic Goals: 1. Patient will identify two or more emotions that lead to relapse for them:  2. Patient will identify two emotions that result when they relapse:  3. Patient will identify two emotions related to recovery:  4. Patient will demonstrate ability to communicate their needs through discussion and/or role plays.   Summary of Patient Progress:     Therapeutic Modalities:   Cognitive Behavioral Therapy Solution-Focused Therapy Assertiveness Training Relapse Prevention Therapy  Daleen SquibbGreg Johnatha Zeidman, LCSW

## 2016-03-06 NOTE — Progress Notes (Signed)
CSW spoke to Father Braulio Boschremblay at [employer].  He reports that pt has had some ability to function on campus and during manic phases has actually completed classwork.  She has gone through several cycles of mania/depression just since the start of the new year, however, and they do not feel like she can continue until she gets her mental health issues addressed.  He also called back and said that the [facility] has worked with pt for some time and would be willing to give background if pt would sign a release. Garner NashGregory Lasundra Hascall, MSW, LCSW Clinical Social Worker 03/06/2016 2:45 PM
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
## 2016-03-06 NOTE — Care Management (Signed)
Advised by SW that patient is independently wealthy and her parents believe she can pay her bill. State funding inappropriate at this time

## 2016-03-06 NOTE — Plan of Care (Signed)
Problem: Coping: Goal: Ability to demonstrate self-control will improve Outcome: Not Progressing Patient is impulsive and labile.  Paces floors.

## 2016-03-06 NOTE — Progress Notes (Signed)
Recreation Therapy Notes  At approximately 2:20 pm, LRT was going to attempt assessment. Per patient's nurse, patient had been sleeping since this morning and was not getting up for anything. Patient's nurse reported patient was not sleeping before coming to the hospital and needed to get some rest.  Jacquelynn CreeGreene,Ellary Casamento M, LRT/CTRS 03/06/2016 4:52 PM

## 2016-03-07 DIAGNOSIS — F4310 Post-traumatic stress disorder, unspecified: Secondary | ICD-10-CM

## 2016-03-07 DIAGNOSIS — F1010 Alcohol abuse, uncomplicated: Secondary | ICD-10-CM

## 2016-03-07 DIAGNOSIS — F429 Obsessive-compulsive disorder, unspecified: Secondary | ICD-10-CM

## 2016-03-07 DIAGNOSIS — Z87891 Personal history of nicotine dependence: Secondary | ICD-10-CM

## 2016-03-07 DIAGNOSIS — F1210 Cannabis abuse, uncomplicated: Secondary | ICD-10-CM

## 2016-03-07 NOTE — Progress Notes (Signed)
D: Pt denies SI/HI/AVH. Pt is labile, moody, and suspicious. Pt took her Lithium this evening, but refused her Trazodone/ Restoril , stating she sleeps fine. Pt was sleep for much of the evening, but when she woke up pt appeared a little paranoid about her surroundings. Patient was IVC'd by Dr. Demetrius CharityP 03/06/16 at 10:30 . Pt continues to be a little hypomanic at times this evening.    A: Pt was offered support and encouragement. Pt was given scheduled medications. Pt was encourage to attend groups. Q 15 minute checks were done for safety.   R:Pt attends groups and interacts well with peers and staff. Pt is taking medication.Pt receptive to treatment and safety maintained on unit.

## 2016-03-07 NOTE — Progress Notes (Signed)
Patient was jumping in the hallway and hyper verbal this morning.Denies suicidal or homicidal ideations and AV hallucinations.Stated that she does not like medicines,her plan is to slowly cut down her medicines.Stated that her family is not very supportive.Compliant with medications.Appropriate with staff & peers.Attended groups.Support & encouragement given.

## 2016-03-07 NOTE — Progress Notes (Signed)
Pt continues to be manic, pt not getting any sleep. Pt coming to nursing station requesting that later she wants to see a Dermatologist. Pt continues to write notes with various things on them such as I want a copy of my medical records.
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
## 2016-03-07 NOTE — Plan of Care (Signed)
Problem: Safety: Goal: Ability to remain free from injury will improve Outcome: Progressing Pt safe on the unit at this time   

## 2016-03-07 NOTE — Progress Notes (Signed)
Patient requests chaplain visit and Bible. Writer attempts to leave message. No voice mail at this time.

## 2016-03-07 NOTE — Progress Notes (Signed)
D: Pt denies SI/HI/AVH. Pt is pleasant and cooperative. Pt very labile, pt observed pacing the unit , at times walking very fast and even seen skipping at times. Pt continues to question her medications. Pt stated she is still fearful   A: Pt was offered support and encouragement. Pt was given scheduled medications. Pt was encourage to attend groups. Q 15 minute checks were done for safety.  R:Pt attends groups and interacts well with peers and staff. Pt is taking medication. Pt has no complaints.Pt receptive to treatment and safety maintained on unit.

## 2016-03-07 NOTE — [facility] Group Notes (Signed)
[facility] LCSW Group Therapy  03/07/2016 3:04 PM  Type of Therapy:  Group Therapy  Participation Level:  Minimal  Participation Quality:  Appropriate and Drowsy  Affect:  Appropriate and Lethargic  Cognitive:  Appropriate  Insight:  Developing/Improving  Engagement in Therapy:  Developing/Improving  Modes of Intervention:  Activity, Discussion, Education, Problem-solving, Reality Testing and Support  Summary of Progress/Problems: Communications: Patients identify how individuals communicate with one another appropriately and inappropriately. Patients will be guided to discuss their thoughts, feelings, and behaviors related to barriers when communicating. The group will process together ways to execute positive and appropriate communications. Patient had difficulty staying awake during group but was appropriate when she participated. Patient provided feedback to her peers and contributed to the group discussion.   Enez Monahan G. Garnette CzechSampson MSW, LCSWA 03/07/2016, 3:07 PM

## 2016-03-08 NOTE — [facility] Group Notes (Signed)
[facility] Group Notes:  (Nursing/MHT/Case Management/Adjunct)  Date:  03/08/2016  Time:  2:54 AM  Type of Therapy:  Psychoeducational Skills  Participation Level:  Did Not Attend  Summary of Progress/Problems:  Deborah MilroyLaquanda Y Jalila Parker 03/08/2016, 2:54 AM

## 2016-03-08 NOTE — Progress Notes (Signed)
Patient continues to be hyper-verbal and manic. Intrusive at times.  Argumentative about medications although will take with encouragement.  Paces halls.   Also noted in hall with leg on handrail doing leg stretches.  Verbalizes that she needs to go home because she does not have insurance and that she needs to pay her bills.  No group attendance today.  Support and encouragement offered.  Safety maintained.
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
## 2016-03-08 NOTE — Plan of Care (Signed)
Problem: Safety: Goal: Ability to remain free from injury will improve Outcome: Progressing Pt safe on the unit at this time   

## 2016-03-08 NOTE — Progress Notes (Signed)
CH responded to a PG. Pt presented hyper initially and quickly moved to tears. Pt stated she is afraid of dying. Pt stated she has seen death with older people, but is afraid of seeing people her own age dying. Pt stated that she is Bi-Polar and that in her research 80% of Bi-Polar attempt suicide. CH asked, "What are the habits of the 20% who do not"? Pt seemed intrigued with this question and stated she would research this. CH challenged Pt to practice the habits of the 20% for today. CH provided prayer and received prayer from the Pt. CH is available for follow up as needed.    03/08/16 1000  Clinical Encounter Type  Visited With Patient;Health care provider  Visit Type Follow-up;Spiritual support  Referral From Nurse  Consult/Referral To Chaplain  Spiritual Encounters  Spiritual Needs Prayer;Emotional

## 2016-03-08 NOTE — [facility] Group Notes (Signed)
[facility] LCSW Group Therapy  03/08/2016 2:36 PM  Type of Therapy:  Group Therapy  Participation Level:  Patient did not attend group. CSW invited patient to group.   Summary of Progress/Problems: Coping Skills: Patients defined and discussed healthy coping skills. Patients identified healthy coping skills they would like to try during hospitalization and after discharge. CSW offered insight to varying coping skills that may have been new to patients such as practicing mindfulness.  Izzy Doubek G. Garnette CzechSampson MSW, LCSWA 03/08/2016, 2:37 PM

## 2016-03-08 NOTE — [facility] Group Notes (Signed)
[facility] Group Notes:  (Nursing/MHT/Case Management/Adjunct)  Date:  03/08/2016  Time:  10:58 PM  Type of Therapy:  Evening Wrap-up Group  Participation Level:  Did Not Attend  Participation Quality:  N/A  Affect:  N/A  Cognitive:  N/A  Insight:  None  Engagement in Group:  Did Not Attend  Modes of Intervention:  Discussion  Summary of Progress/Problems:  Tomasita MorrowChelsea Nanta Lakeishia Truluck 03/08/2016, 10:58 PM

## 2016-03-08 NOTE — Progress Notes (Signed)
D: Pt denies SI/HI/AVH. Pt is pleasant and cooperative. Pt stated she was feeling better, pt stated she wanted to try all her medications this evening.   A: Pt was offered support and encouragement. Pt was given scheduled medications. Pt was encourage to attend groups. Q 15 minute checks were done for safety.   R:Pt attends groups and interacts well with peers and staff. Pt is taking medication. Pt has no complaints.Pt receptive to treatment and safety maintained on unit.

## 2016-03-09 LAB — LITHIUM LEVEL: Lithium Lvl: 0.44 mmol/L — ABNORMAL LOW (ref 0.60–1.20)

## 2016-03-09 NOTE — [facility] Group Notes (Signed)
[facility] Group Notes:  (Nursing/MHT/Case Management/Adjunct)  Date:  03/09/2016  Time:  11:16 PM  Type of Therapy:  Psychoeducational Skills  Participation Level:  Active  Participation Quality:  Appropriate  Affect:  Appropriate  Cognitive:  Alert  Insight:  Good  Engagement in Group:  Engaged  Modes of Intervention:  Activity  Summary of Progress/Problems:  Deborah Parker 03/09/2016, 11:16 PM

## 2016-03-09 NOTE — Progress Notes (Signed)
Patient is pleasant & cooperative.Denies suicidal or homicidal ideations.Appropriate with staff & peers.Patient was sleeping in the afternoon.Patient is looking forward for discharge and going back to school.Compliant with medications.Appetite & energy level good.Support & encouragement given.

## 2016-03-09 NOTE — [facility] Group Notes (Signed)
[facility] LCSW Group Therapy Note  Date/Time: 03/09/16, 1300  Type of Therapy and Topic:  Group Therapy:  Overcoming Obstacles  Participation Level:  Pt did not attend group.  Description of Group:    In this group patients will be encouraged to explore what they see as obstacles to their own wellness and recovery. They will be guided to discuss their thoughts, feelings, and behaviors related to these obstacles. The group will process together ways to cope with barriers, with attention given to specific choices patients can make. Each patient will be challenged to identify changes they are motivated to make in order to overcome their obstacles. This group will be process-oriented, with patients participating in exploration of their own experiences as well as giving and receiving support and challenge from other group members.  Therapeutic Goals: 1. Patient will identify personal and current obstacles as they relate to admission. 2. Patient will identify barriers that currently interfere with their wellness or overcoming obstacles.  3. Patient will identify feelings, thought process and behaviors related to these barriers. 4. Patient will identify two changes they are willing to make to overcome these obstacles:    Summary of Patient Progress      Therapeutic Modalities:   Cognitive Behavioral Therapy Solution Focused Therapy Motivational Interviewing Relapse Prevention Therapy  Greg Jasmin Trumbull, LCSW 
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
## 2016-03-09 NOTE — Tx Team (Signed)
Interdisciplinary Treatment and Diagnostic Plan Update  03/09/2016 Time of Session: 10:30 AM Deborah Parker MRN: [id number]  Principal Diagnosis: Bipolar I disorder, current or most recent episode manic, with psychotic features (Deborah Parker)  Secondary Diagnoses: Principal Problem:   Bipolar I disorder, current or most recent episode manic, with psychotic features (Deborah Parker) Active Problems:   OCD (obsessive compulsive disorder)   PTSD (post-traumatic stress disorder)   Cannabis use disorder, mild, abuse   Alcohol use disorder, mild, abuse   Current Medications:  Current Facility-Administered Medications  Medication Dose Route Frequency Provider Last Rate Last Dose  . acetaminophen (TYLENOL) tablet 650 mg  650 mg Oral Q6H PRN Gonzella Lex, MD      . alum & mag hydroxide-simeth (MAALOX/MYLANTA) 200-200-20 MG/5ML suspension 30 mL  30 mL Oral Q4H PRN Gonzella Lex, MD      . hydrOXYzine (ATARAX/VISTARIL) tablet 25 mg  25 mg Oral TID PRN Gonzella Lex, MD   25 mg at 03/06/16 0844  . lithium carbonate (LITHOBID) CR tablet 300 mg  300 mg Oral Q12H Jolanta B Pucilowska, MD   300 mg at 03/09/16 0843  . magnesium hydroxide (MILK OF MAGNESIA) suspension 30 mL  30 mL Oral Daily PRN Gonzella Lex, MD      . temazepam (RESTORIL) capsule 30 mg  30 mg Oral QHS Clovis Fredrickson, MD   30 mg at 03/08/16 2208  . traZODone (DESYREL) tablet 100 mg  100 mg Oral QHS Clovis Fredrickson, MD   100 mg at 03/08/16 2208  . ziprasidone (GEODON) capsule 80 mg  80 mg Oral BID WC Jolanta B Pucilowska, MD   80 mg at 03/09/16 0843   PTA Medications: Prescriptions Prior to Admission  Medication Sig Dispense Refill Last Dose  . divalproex (DEPAKOTE ER) 500 MG 24 hr tablet Take 500 mg by mouth 2 (two) times daily.   UNKNOWN at UNKNOWN  . ondansetron (ZOFRAN ODT) 4 MG disintegrating tablet Allow 1-2 tablets to dissolve in your mouth every 8 hours as needed for nausea/vomiting (Patient not taking: Reported on 03/03/2016) 30  tablet 0 Not Taking at Unknown time  . QUEtiapine (SEROQUEL) 100 MG tablet Take 100-200 mg by mouth at bedtime as needed.    UNKNOWN at UNKNOWN    Patient Stressors: Medication change or noncompliance Substance abuse Traumatic event  Patient Strengths: Curator fund of knowledge Motivation for treatment/growth  Treatment Modalities: Medication Management, Group therapy, Case management,  1 to 1 session with clinician, Psychoeducation, Recreational therapy.   Physician Treatment Plan for Primary Diagnosis: Bipolar I disorder, current or most recent episode manic, with psychotic features (Deborah Parker) Long Term Goal(s): Improvement in symptoms so as ready for discharge Improvement in symptoms so as ready for discharge   Short Term Goals: Ability to identify changes in lifestyle to reduce recurrence of condition will improve Ability to verbalize feelings will improve Ability to disclose and discuss suicidal ideas Ability to demonstrate self-control will improve Ability to identify and develop effective coping behaviors will improve Compliance with prescribed medications will improve Ability to identify triggers associated with substance abuse/mental health issues will improve Ability to identify changes in lifestyle to reduce recurrence of condition will improve Ability to demonstrate self-control will improve Ability to identify triggers associated with substance abuse/mental health issues will improve  Medication Management: Evaluate patient's response, side effects, and tolerance of medication regimen.  Therapeutic Interventions: 1 to 1 sessions, Unit Group sessions and Medication administration.  Evaluation of Outcomes: Not  Met  Physician Treatment Plan for Secondary Diagnosis: Principal Problem:   Bipolar I disorder, current or most recent episode manic, with psychotic features (Deborah Parker) Active Problems:   OCD (obsessive compulsive disorder)   PTSD (post-traumatic  stress disorder)   Cannabis use disorder, mild, abuse   Alcohol use disorder, mild, abuse  Long Term Goal(s): Improvement in symptoms so as ready for discharge Improvement in symptoms so as ready for discharge   Short Term Goals: Ability to identify changes in lifestyle to reduce recurrence of condition will improve Ability to verbalize feelings will improve Ability to disclose and discuss suicidal ideas Ability to demonstrate self-control will improve Ability to identify and develop effective coping behaviors will improve Compliance with prescribed medications will improve Ability to identify triggers associated with substance abuse/mental health issues will improve Ability to identify changes in lifestyle to reduce recurrence of condition will improve Ability to demonstrate self-control will improve Ability to identify triggers associated with substance abuse/mental health issues will improve     Medication Management: Evaluate patient's response, side effects, and tolerance of medication regimen.  Therapeutic Interventions: 1 to 1 sessions, Unit Group sessions and Medication administration.  Evaluation of Outcomes: Not Met   RN Treatment Plan for Primary Diagnosis: Bipolar I disorder, current or most recent episode manic, with psychotic features (Deborah Parker) Long Term Goal(s): Knowledge of disease and therapeutic regimen to maintain health will improve  Short Term Goals: Ability to remain free from injury will improve, Ability to verbalize frustration and anger appropriately will improve, Ability to demonstrate self-control, Ability to participate in decision making will improve, Ability to verbalize feelings will improve, Ability to disclose and discuss suicidal ideas, Ability to identify and develop effective coping behaviors will improve and Compliance with prescribed medications will improve  Medication Management: RN will administer medications as ordered by provider, will assess and  evaluate patient's response and provide education to patient for prescribed medication. RN will report any adverse and/or side effects to prescribing provider.  Therapeutic Interventions: 1 on 1 counseling sessions, Psychoeducation, Medication administration, Evaluate responses to treatment, Monitor vital signs and CBGs as ordered, Perform/monitor CIWA, COWS, AIMS and Fall Risk screenings as ordered, Perform wound care treatments as ordered.  Evaluation of Outcomes: Not Met   LCSW Treatment Plan for Primary Diagnosis: Bipolar I disorder, current or most recent episode manic, with psychotic features ([place]) Long Term Goal(s): Safe transition to appropriate next level of care at discharge, Engage patient in therapeutic group addressing interpersonal concerns.  Short Term Goals: Engage patient in aftercare planning with referrals and resources, Increase social support, Facilitate acceptance of mental health diagnosis and concerns and Increase skills for wellness and recovery  Therapeutic Interventions: Assess for all discharge needs, 1 to 1 time with Social worker, Explore available resources and support systems, Assess for adequacy in community support network, Educate family and significant other(s) on suicide prevention, Complete Psychosocial Assessment, Interpersonal group therapy.  Evaluation of Outcomes: Not Met   Progress in Treatment: Attending groups: Yes. Participating in groups: Yes. Taking medication as prescribed: Yes. Toleration medication: Yes. Family/Significant other contact made: Yes, individual(s) contacted:  parents Patient understands diagnosis: Yes. Discussing patient identified problems/goals with staff: Yes. Medical problems stabilized or resolved: Yes. Denies suicidal/homicidal ideation: Yes. Issues/concerns per patient self-inventory: Yes. Other: none  New problem(s) identified: No, Describe:  none  New Short Term/Long Term Goal(s): Pt's goal is to return to  baseline.  Discharge Plan or Barriers: CSW assessing for appropriate plan.  Reason for Continuation of Hospitalization:  Mania  Estimated Length of Stay: 1-2 days  Attendees: Patient: 03/06/2016   Physician: Dr. Bary Leriche, MD 03/06/2016   Nursing: Polly Cobia, RN 03/06/2016   RN Care Manager: 03/06/2016   Social Worker: Glorious Peach, MSW, LCSW 03/06/2016   Recreational Therapist:  03/06/2016   Other:  03/06/2016  Other:  03/06/2016   Other: 03/06/2016      Scribe for Treatment Team: Emilie Rutter, [place] 03/09/2016 10:38 AM

## 2016-03-09 NOTE — BHH Group Notes (Signed)
BHH Group Notes:  (Nursing/MHT/Case Management/Adjunct)  Date:  03/09/2016  Time:  5:54 PM  Type of Therapy:  Psychoeducational Skills  Participation Level:  Did Not Attend  Gerlad Pelzel C Kateria Cutrona 03/09/2016, 5:54 PM 

## 2016-03-10 MED ORDER — TRAZODONE HCL 100 MG PO TABS: 100.0000 mg | ORAL_TABLET | Freq: Every day | ORAL | 1 refills | Status: AC

## 2016-03-10 MED ORDER — ZIPRASIDONE HCL 80 MG PO CAPS: 80.0000 mg | ORAL_CAPSULE | Freq: Two times a day (BID) | ORAL | 1 refills | Status: AC

## 2016-03-10 MED ORDER — TRAZODONE HCL 100 MG PO TABS: 100.0000 mg | ORAL_TABLET | Freq: Every day | ORAL | 1 refills | Status: DC

## 2016-03-10 MED ORDER — LITHIUM CARBONATE ER 300 MG PO TBCR: 300.0000 mg | EXTENDED_RELEASE_TABLET | Freq: Two times a day (BID) | ORAL | 1 refills | Status: AC

## 2016-03-10 MED ORDER — LITHIUM CARBONATE ER 300 MG PO TBCR: 300.0000 mg | EXTENDED_RELEASE_TABLET | Freq: Two times a day (BID) | ORAL | 1 refills | Status: DC

## 2016-03-10 MED ORDER — ZIPRASIDONE HCL 80 MG PO CAPS: 80.0000 mg | ORAL_CAPSULE | Freq: Two times a day (BID) | ORAL | 1 refills | Status: DC

## 2016-03-10 NOTE — Plan of Care (Signed)
Problem: Education: Goal: Verbalization of understanding the information provided will improve Outcome: Progressing Verbalized feelings to staff.

## 2016-03-10 NOTE — [facility] Suicide Risk Assessment (Signed)
[facility]   Principal Problem: Bipolar I disorder, current or most recent episode manic, with psychotic features [facility](HCC) Discharge Diagnoses:  Patient Active Problem List   Diagnosis Date Noted  . OCD (obsessive compulsive disorder) [F42.9] 03/04/2016  . PTSD (post-traumatic stress disorder) [F43.10] 03/04/2016  . Cannabis use disorder, mild, abuse [F12.10] 03/04/2016  . Alcohol use disorder, mild, abuse [F10.10] 03/04/2016  . Bipolar I disorder, current or most recent episode manic, with psychotic features (HCC) [F31.2] 03/03/2016    Total Time spent with patient: 30 minutes  Musculoskeletal: Strength & Muscle Tone: within normal limits Gait & Station: normal Patient leans: N/A  Psychiatric Specialty Exam: Review of Systems  All other systems reviewed and are negative.   Blood pressure 116/70, pulse 77, temperature 98.4 F (36.9 C), resp. rate 18, height 5\' 4"  (1.626 m), weight 57.2 kg (126 lb), last menstrual period 02/10/2016, SpO2 98 %.Body mass index is 21.63 kg/m.  General Appearance: Casual  Eye Contact::  Good  Speech:  Clear and Coherent409  Volume:  Normal  Mood:  Euthymic  Affect:  Appropriate  Thought Process:  Goal Directed and Descriptions of Associations: Intact  Orientation:  Full (Time, Place, and Person)  Thought Content:  WDL  Suicidal Thoughts:  No  Homicidal Thoughts:  No  Memory:  Immediate;   Fair Recent;   Fair Remote;   Fair  Judgement:  Impaired  Insight:  Present  Psychomotor Activity:  Normal  Concentration:  Fair  Recall:  [employer]  Fund of Knowledge:Fair  Language: Fair  Akathisia:  No  Handed:  Right  AIMS (if indicated):     Assets:  Communication Skills Desire for Improvement Financial Resources/Insurance Housing Physical Health Resilience Social Support Vocational/Educational  Sleep:  Number of Hours: 8.5  Cognition: WNL  ADL's:  Intact   Mental Status Per Nursing Assessment::   On Admission:   NA  Demographic Factors:  Adolescent or young adult, Caucasian and Unemployed  Loss Factors: NA  Historical Factors: Family history of mental illness or substance abuse and Impulsivity  Risk Reduction Factors:   Sense of responsibility to family, Religious beliefs about death, Living with another person, especially a relative, Positive social support and Positive therapeutic relationship  Continued Clinical Symptoms:  Bipolar Disorder:   Mixed State  Cognitive Features That Contribute To Risk:  None    Suicide Risk:  Minimal: No identifiable suicidal ideation.  Patients presenting with no risk factors but with morbid ruminations; may be classified as minimal risk based on the severity of the depressive symptoms    Plan Of Care/Follow-up recommendations:  Activity:  As tolerated. Diet:  Regular. Other:  Keep follow-up appointments.  Kristine LineaJolanta Pucilowska, MD 03/10/2016, 9:15 AM

## 2016-03-10 NOTE — Progress Notes (Signed)
Patient discharged home. DC instructions provided and explained. Medications reviewed. Rx given. All questions answered. Pt stable at discharge. Denies SI, HI, AVH. No negative behaviors. Belongings and stored meds in pharmacy returned. AVS, transition and risk assessment given to patient. Patient stable at discharge
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
## 2016-03-10 NOTE — Progress Notes (Signed)
  [facility] :  Will you be returning to the same living situation after discharge:  No. At discharge, do you have transportation home?: Yes,  mother will pick pt up. Do you have the ability to pay for your medications: No.  Release of information consent forms completed and in the chart;  Patient's signature needed at discharge.  Patient to Follow up at: Follow-up Information    [facility]. Go on 03/12/2016.   Why:  [facility] initial assessment with your therapist, AB Deborah Parker. Your appointment is March 8th @ 11AM. Please bring discharge paperwork to this appointment. Contact information: Address: [address] Phone: [phone] Fax: [phone]          Next level of care provider [facility] Link:no  Safety Planning and Suicide Prevention discussed: Yes,  SPE completed with parents.  Have you used any form of tobacco in the last 30 days? (Cigarettes, Smokeless Tobacco, Cigars, and/or Pipes): No  Has patient been referred to the Quitline?: N/A patient is not a smoker  Patient has been referred for addiction treatment: Pt. refused referral  Lynden OxfordKadijah R Troy Parker, MSW, LCSW-A 03/10/2016, 9:58 AM

## 2016-03-10 NOTE — Progress Notes (Signed)
D: Pt denies SI/HI/AVH. Pt is pleasant, affect is flat but brightens upon approach. Pt appears less anxious, speech is non pressured, not hyper verbal and interacting with peers and staff appropriately.  A: Pt was offered support and encouragement. Pt was encouraged to attend groups. Q 15 minute checks were done for safety.  R:Pt attends groups and interacts well with peers and staff. Pt went  to bed right after group, writer attempted  several times to wake patient up for medication  but she was sound asleep, no  acute distress noted. Safety maintained on unit will continue to closely monitor.
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
